# Patient Record
Sex: Male | Born: 1980 | Hispanic: No | Marital: Single | Smoking: Never smoker
Health system: Southern US, Community
[De-identification: ages and names within clinical notes are randomized; demographics above are authoritative.]

---

## 2007-10-15 ENCOUNTER — Ambulatory Visit: Payer: Self-pay | Admitting: Internal Medicine

## 2007-10-15 DIAGNOSIS — IMO0002 Reserved for concepts with insufficient information to code with codable children: Secondary | ICD-10-CM

## 2007-10-15 DIAGNOSIS — R079 Chest pain, unspecified: Secondary | ICD-10-CM

## 2007-10-20 ENCOUNTER — Encounter (INDEPENDENT_AMBULATORY_CARE_PROVIDER_SITE_OTHER): Payer: Self-pay | Admitting: Internal Medicine

## 2007-10-20 ENCOUNTER — Ambulatory Visit (HOSPITAL_COMMUNITY): Admission: RE | Admit: 2007-10-20 | Discharge: 2007-10-20 | Payer: Self-pay | Admitting: Internal Medicine

## 2007-11-02 ENCOUNTER — Ambulatory Visit: Payer: Self-pay | Admitting: Internal Medicine

## 2007-11-12 LAB — CONVERTED CEMR LAB
CO2: 28 meq/L (ref 19–32)
Cholesterol: 211 mg/dL — ABNORMAL HIGH (ref 0–200)
Creatinine, Ser: 1.07 mg/dL (ref 0.40–1.50)
Glucose, Bld: 97 mg/dL (ref 70–99)
Total Bilirubin: 0.7 mg/dL (ref 0.3–1.2)
Total CHOL/HDL Ratio: 4.1
Triglycerides: 98 mg/dL (ref <150)
VLDL: 20 mg/dL (ref 0–40)

## 2009-11-25 IMAGING — CR DG LUMBAR SPINE COMPLETE 4+V
5 series · 5 of 5 positions shown · non-contrast
Comparison: None available

CLINICAL DATA: Chest and low back pain

LUMBAR SPINE - COMPLETE 4+ VIEW

[t l-spine a.p.]
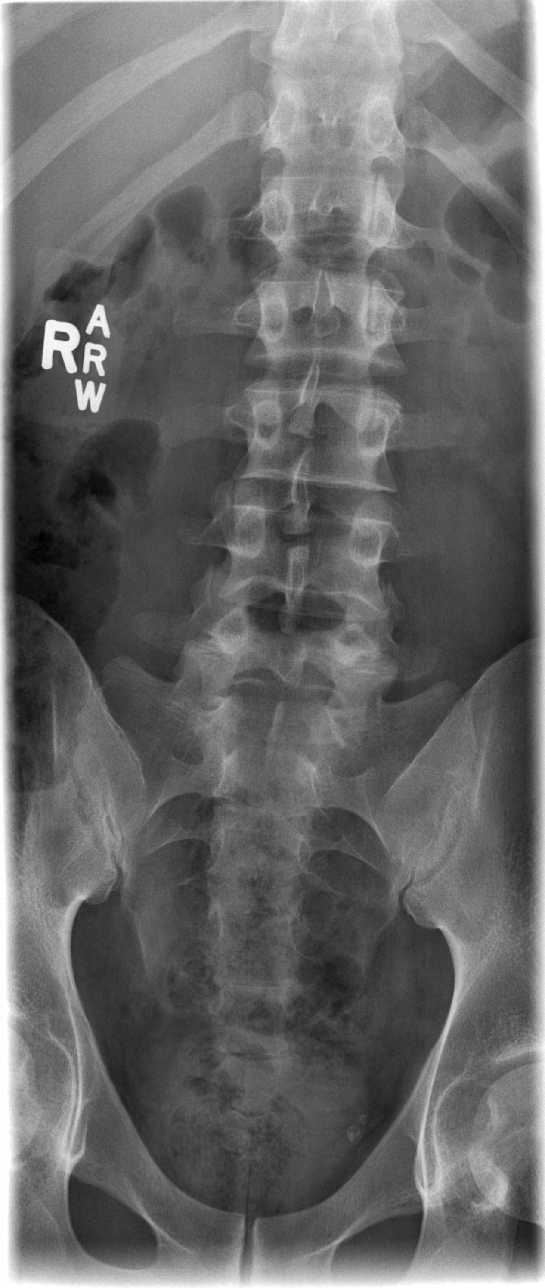

[t l-spine oblique exposure (1 of 2)]
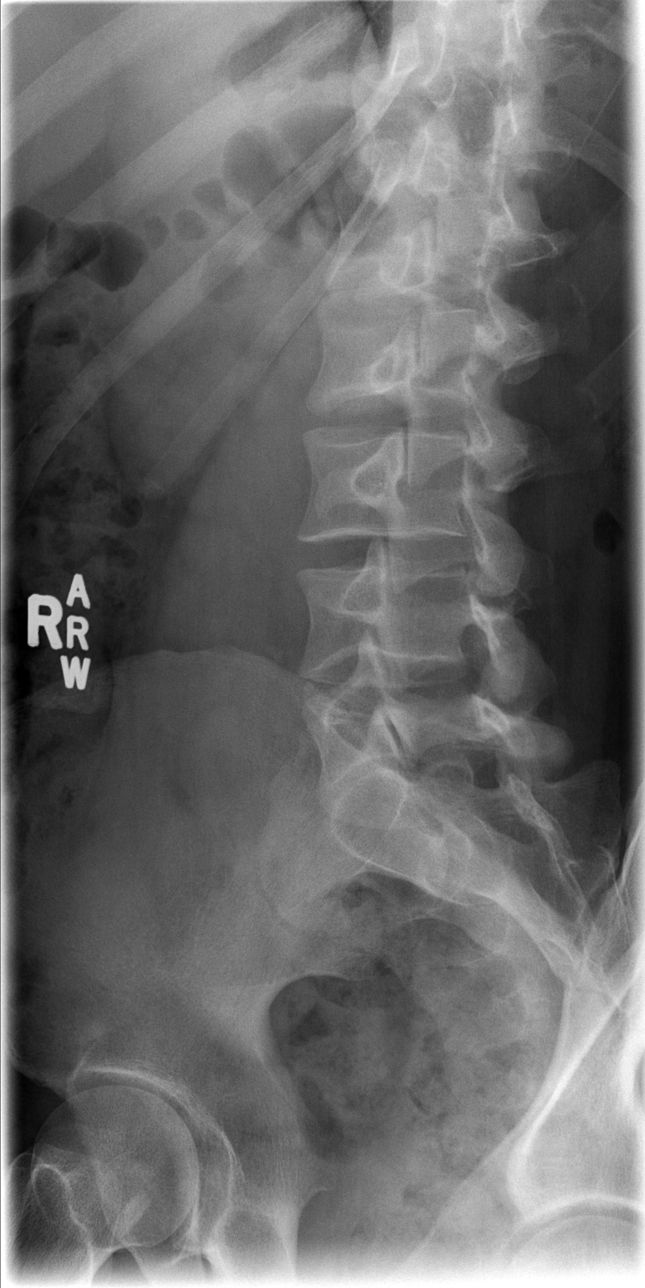

[t l-spine oblique exposure (2 of 2)]
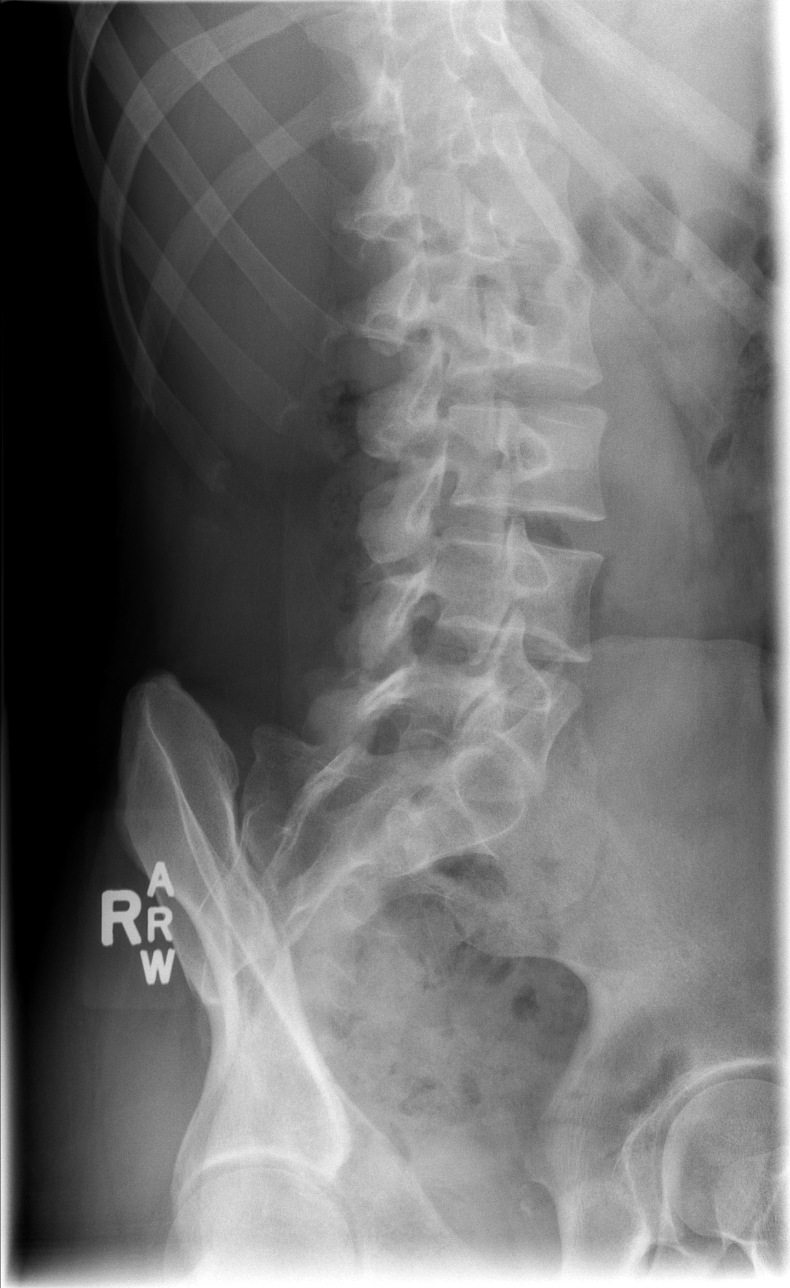

[t l-spine lat]
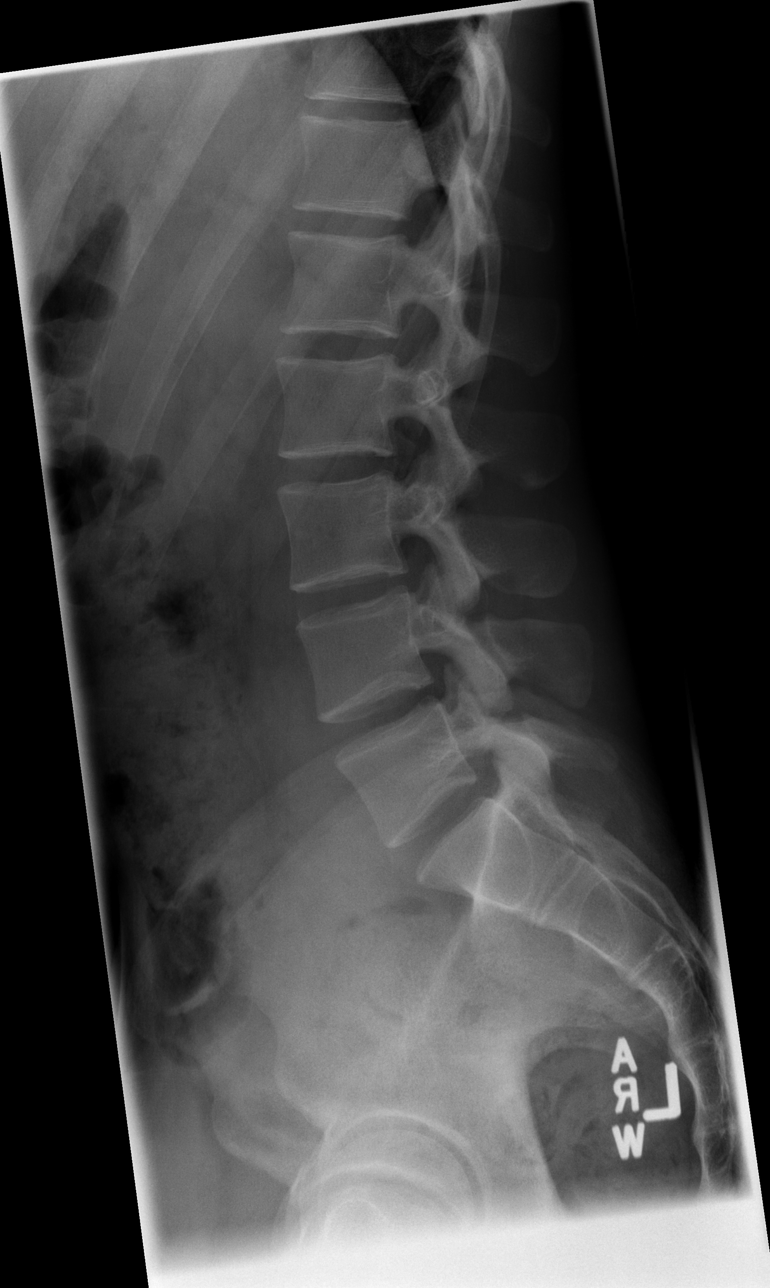

[t l-spine l5-s1 spot]
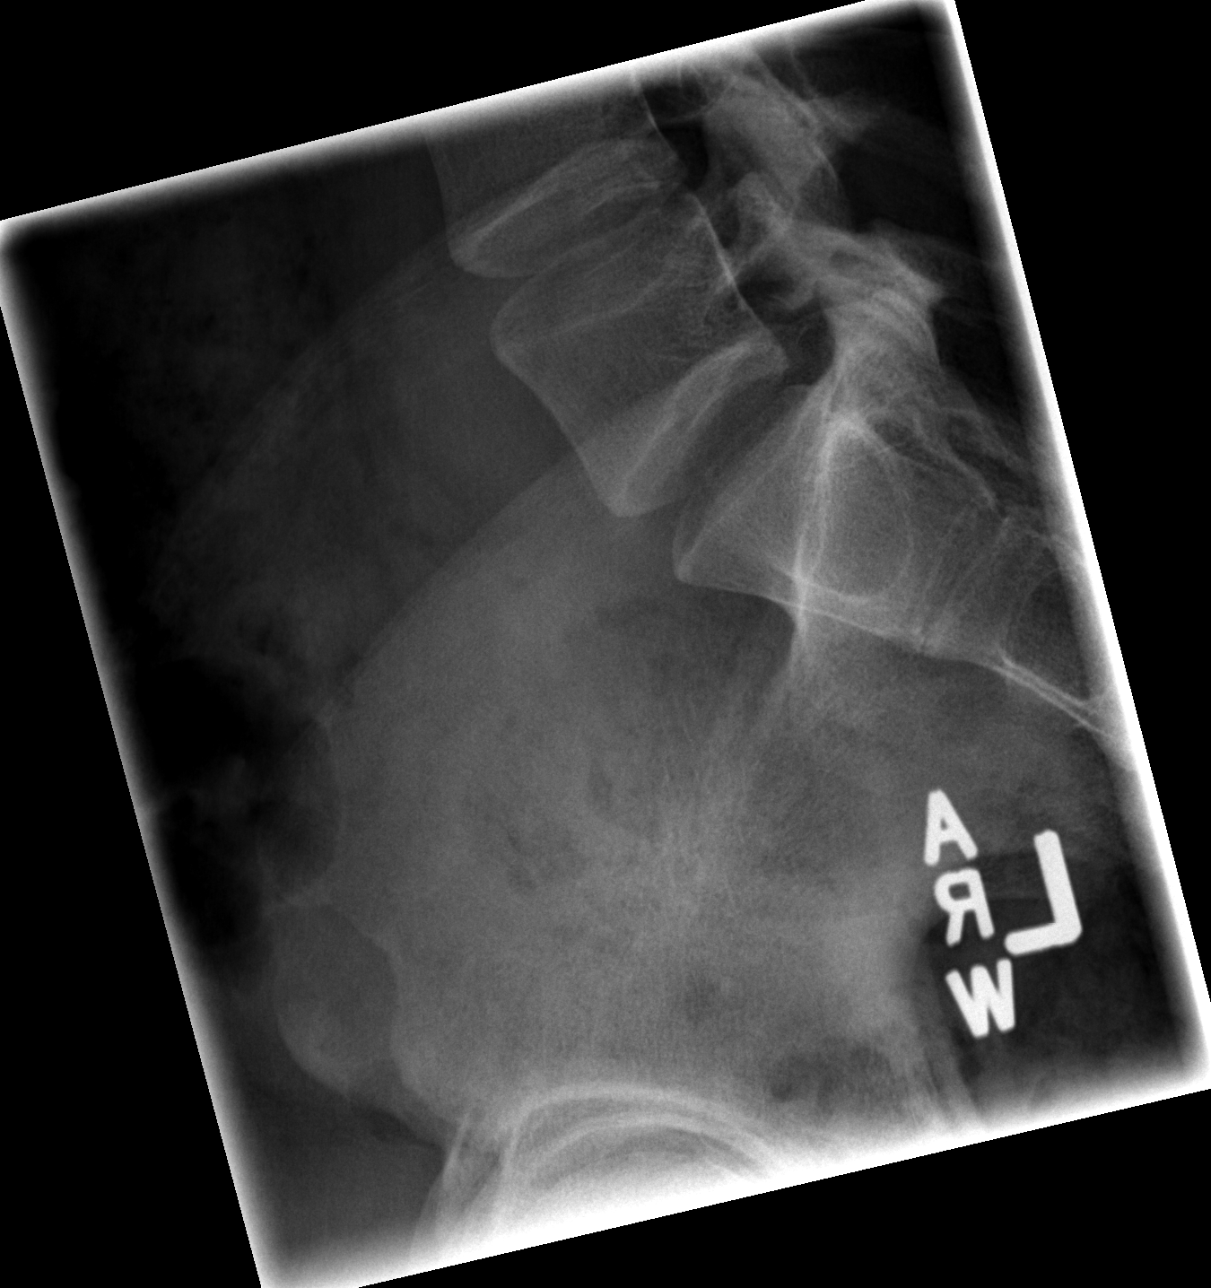

[5 of 5 positions shown; findings below may reference images not displayed]

FINDINGS: There are left pelvic phleboliths. There is no evidence
of lumbar spine fracture.  Alignment is normal.  Intervertebral
disc spaces are maintained.
IMPRESSION: Negative.

## 2016-04-09 DIAGNOSIS — G8929 Other chronic pain: Secondary | ICD-10-CM | POA: Insufficient documentation

## 2020-05-03 ENCOUNTER — Other Ambulatory Visit: Payer: Self-pay

## 2020-05-04 ENCOUNTER — Ambulatory Visit (INDEPENDENT_AMBULATORY_CARE_PROVIDER_SITE_OTHER): Payer: Self-pay | Admitting: Nurse Practitioner

## 2020-05-04 ENCOUNTER — Encounter: Payer: Self-pay | Admitting: Nurse Practitioner

## 2020-05-04 VITALS — BP 100/60 | HR 76 | Temp 97.8°F | Ht 66.75 in | Wt 154.0 lb

## 2020-05-04 DIAGNOSIS — Z136 Encounter for screening for cardiovascular disorders: Secondary | ICD-10-CM

## 2020-05-04 DIAGNOSIS — M25562 Pain in left knee: Secondary | ICD-10-CM | POA: Insufficient documentation

## 2020-05-04 DIAGNOSIS — Z13220 Encounter for screening for lipoid disorders: Secondary | ICD-10-CM

## 2020-05-04 DIAGNOSIS — Z0000 Encounter for general adult medical examination without abnormal findings: Secondary | ICD-10-CM

## 2020-05-04 LAB — LIPID PANEL
Cholesterol: 228 mg/dL — ABNORMAL HIGH (ref 0–200)
HDL: 44.3 mg/dL (ref >39.00)
LDL Cholesterol: 166 mg/dL — ABNORMAL HIGH (ref 0–99)
NonHDL: 183.86
Total CHOL/HDL Ratio: 5
Triglycerides: 87 mg/dL (ref 0.0–149.0)
VLDL: 17.4 mg/dL (ref 0.0–40.0)

## 2020-05-04 LAB — COMPREHENSIVE METABOLIC PANEL
ALT: 21 U/L (ref 0–53)
AST: 18 U/L (ref 0–37)
Albumin: 4.4 g/dL (ref 3.5–5.2)
Alkaline Phosphatase: 75 U/L (ref 39–117)
BUN: 11 mg/dL (ref 6–23)
CO2: 32 mEq/L (ref 19–32)
Calcium: 9.7 mg/dL (ref 8.4–10.5)
Chloride: 102 mEq/L (ref 96–112)
Creatinine, Ser: 1.08 mg/dL (ref 0.40–1.50)
GFR: 87.25 mL/min (ref >60.00)
Glucose, Bld: 95 mg/dL (ref 70–99)
Potassium: 4.1 mEq/L (ref 3.5–5.1)
Sodium: 142 mEq/L (ref 135–145)
Total Bilirubin: 0.8 mg/dL (ref 0.2–1.2)
Total Protein: 7 g/dL (ref 6.0–8.3)

## 2020-05-04 LAB — CBC WITH DIFFERENTIAL/PLATELET
Basophils Absolute: 0 10*3/uL (ref 0.0–0.1)
Basophils Relative: 0.5 % (ref 0.0–3.0)
Eosinophils Absolute: 0.2 10*3/uL (ref 0.0–0.7)
Eosinophils Relative: 4.2 % (ref 0.0–5.0)
HCT: 46.5 % (ref 39.0–52.0)
Hemoglobin: 15.7 g/dL (ref 13.0–17.0)
Lymphocytes Relative: 28.8 % (ref 12.0–46.0)
Lymphs Abs: 1.7 10*3/uL (ref 0.7–4.0)
MCHC: 33.8 g/dL (ref 30.0–36.0)
MCV: 88.6 fl (ref 78.0–100.0)
Monocytes Absolute: 0.3 10*3/uL (ref 0.1–1.0)
Monocytes Relative: 5.9 % (ref 3.0–12.0)
Neutro Abs: 3.5 10*3/uL (ref 1.4–7.7)
Neutrophils Relative %: 60.6 % (ref 43.0–77.0)
Platelets: 286 10*3/uL (ref 150.0–400.0)
RBC: 5.24 Mil/uL (ref 4.22–5.81)
RDW: 13.4 % (ref 11.5–15.5)
WBC: 5.8 10*3/uL (ref 4.0–10.5)

## 2020-05-04 NOTE — Patient Instructions (Signed)
Thank you for choosing [facility].  Go to lab for blood draw.  Preventive Care 39-39 Years Old, Male Preventive care refers to lifestyle choices and visits with your health care provider that can promote health and wellness. This includes:  A yearly physical exam. This is also called an annual wellness visit.  Regular dental and eye exams.  Immunizations.  Screening for certain conditions.  Healthy lifestyle choices, such as: ? Eating a healthy diet. ? Getting regular exercise. ? Not using drugs or products that contain nicotine and tobacco. ? Limiting alcohol use. What can I expect for my preventive care visit? Physical exam Your health care provider may check your:  Height and weight. These may be used to calculate your BMI (body mass index). BMI is a measurement that tells if you are at a healthy weight.  Heart rate and blood pressure.  Body temperature.  Skin for abnormal spots. Counseling Your health care provider may ask you questions about your:  Past medical problems.  Family's medical history.  Alcohol, tobacco, and drug use.  Emotional well-being.  Home life and relationship well-being.  Sexual activity.  Diet, exercise, and sleep habits.  Work and work Astronomer.  Access to firearms. What immunizations do I need? Vaccines are usually given at various ages, according to a schedule. Your health care provider will recommend vaccines for you based on your age, medical history, and lifestyle or other factors, such as travel or where you work.   What tests do I need? Blood tests  Lipid and cholesterol levels. These may be checked every 5 years starting at age 39.  Hepatitis C test.  Hepatitis B test. Screening  Diabetes screening. This is done by checking your blood sugar (glucose) after you have not eaten for a while (fasting).  Genital exam to check for testicular cancer or hernias.  STD (sexually transmitted disease) testing, if  you are at risk. Talk with your health care provider about your test results, treatment options, and if necessary, the need for more tests.   Follow these instructions at home: Eating and drinking  Eat a healthy diet that includes fresh fruits and vegetables, whole grains, lean protein, and low-fat dairy products.  Drink enough fluid to keep your urine pale yellow.  Take vitamin and mineral supplements as recommended by your health care provider.  Do not drink alcohol if your health care provider tells you not to drink.  If you drink alcohol: ? Limit how much you have to 0-2 drinks a day. ? Be aware of how much alcohol is in your drink. In the [place] one drink equals one 12 oz bottle of beer (355 mL), one 5 oz glass of wine (148 mL), or one 1 oz glass of hard liquor (44 mL).   Lifestyle  Take daily care of your teeth and gums. Brush your teeth every morning and night with fluoride toothpaste. Floss one time each day.  Stay active. Exercise for at least 30 minutes 5 or more days each week.  Do not use any products that contain nicotine or tobacco, such as cigarettes, e-cigarettes, and chewing tobacco. If you need help quitting, ask your health care provider.  Do not use drugs.  If you are sexually active, practice safe sex. Use a condom or other form of protection to prevent STIs (sexually transmitted infections).  Find healthy ways to cope with stress, such as: ? Meditation, yoga, or listening to music. ? Journaling. ? Talking to a trusted person. ?  Spending time with friends and family. Safety  Always wear your seat belt while driving or riding in a vehicle.  Do not drive: ? If you have been drinking alcohol. Do not ride with someone who has been drinking. ? When you are tired or distracted. ? While texting.  Wear a helmet and other protective equipment during sports activities.  If you have firearms in your house, make sure you follow all gun safety procedures.  Seek help  if you have been physically or sexually abused. What's next?  Go to your health care provider once a year for an annual wellness visit.  Ask your health care provider how often you should have your eyes and teeth checked.  Stay up to date on all vaccines. This information is not intended to replace advice given to you by your health care provider. Make sure you discuss any questions you have with your health care provider. Document Revised: 11/25/2018 Document Reviewed: 03/05/2018 Elsevier Patient Education  2021 Elsevier Inc.  

## 2020-05-04 NOTE — Progress Notes (Signed)
Subjective:    Patient ID: Jacob Odonnell, male    DOB: June 30, 1980, 40 y.o.   MRN: [id number]  Patient presents today for CPE and establish care  HPI  Sexual History (orientation,birth control, marital status, STD):single, not sexually active  Depression/Suicide: Depression screen [facility] 2/9 05/04/2020  Decreased Interest 0  Down, Depressed, Hopeless 0  PHQ - 2 Score 0  Altered sleeping 1  Tired, decreased energy 1  Change in appetite 0  Feeling bad or failure about yourself  0  Trouble concentrating 0  Moving slowly or fidgety/restless 0  Suicidal thoughts 0  PHQ-9 Score 2  Difficult doing work/chores Not difficult at all   Vision:not needed per patient  Dental:not needed per patient  Immunizations: (TDAP, Hep C screen, Pneumovax, Influenza, zoster)  Health Maintenance  Topic Date Due  . COVID-19 Vaccine (3 - Booster for Moderna series) 03/12/2020  . Flu Shot  06/22/2020*  . Tetanus Vaccine  05/04/2021*  .  Hepatitis C: One time screening is recommended by Center for Disease Control  (CDC) for  adults born from [birth year] through 1964.   05/04/2021*  . HIV Screening  05/04/2021*  *Topic was postponed. The date shown is not the original due date.   Diet:regular. Exercise: none Weight:  Wt Readings from Last 3 Encounters:  05/04/20 154 lb (69.9 kg)   Fall Risk: Fall Risk  05/04/2020  Falls in the past year? 0  Number falls in past yr: 0  Injury with Fall? 0  Risk for fall due to : No Fall Risks  Follow up Falls evaluation completed   Medications and allergies reviewed with patient and updated if appropriate.  Patient Active Problem List   Diagnosis Date Noted  . Left anterior knee pain 05/04/2020  . Chronic right shoulder pain 04/09/2016  . LUMBAR RADICULOPATHY 10/15/2007  . CHEST PAIN 10/15/2007   No current outpatient medications on file prior to visit.   No current facility-administered medications on file prior to visit.   History reviewed. No pertinent  past medical history.  Social History   Socioeconomic History  . Marital status: Single    Spouse name: Not on file  . Number of children: 0  . Years of education: Not on file  . Highest education level: Not on file  Occupational History  . Occupation: Electronics engineer  Tobacco Use  . Smoking status: Never Smoker  . Smokeless tobacco: Never Used  Vaping Use  . Vaping Use: Never used  Substance and Sexual Activity  . Alcohol use: Never  . Drug use: Never  . Sexual activity: Not Currently  Other Topics Concern  . Not on file  Social History Narrative  . Not on file   Social Determinants of Health   Financial Resource Strain: Not on file  Food Insecurity: Not on file  Transportation Needs: Not on file  Physical Activity: Not on file  Stress: Not on file  Social Connections: Not on file    Family History  Problem Relation Age of Onset  . Cancer Mother 41       breast       Review of Systems  Constitutional: Negative for fever, malaise/fatigue and weight loss.  HENT: Negative for congestion and sore throat.   Eyes:       Negative for visual changes  Respiratory: Negative for cough and shortness of breath.   Cardiovascular: Negative for chest pain, palpitations and leg swelling.  Gastrointestinal: Negative for blood in stool, constipation, diarrhea and heartburn.  Genitourinary: Negative for dysuria, frequency and urgency.  Musculoskeletal: Negative for falls, joint pain and myalgias.  Skin: Negative for rash.  Neurological: Negative for dizziness, sensory change and headaches.  Endo/Heme/Allergies: Does not bruise/bleed easily.  Psychiatric/Behavioral: Negative for depression, substance abuse and suicidal ideas. The patient is not nervous/anxious.    Objective:   Vitals:   05/04/20 0849  BP: 100/60  Pulse: 76  Temp: 97.8 F (36.6 C)  SpO2: 98%   Body mass index is 24.3 kg/m.  Physical Examination:  Physical Exam Vitals reviewed.  Constitutional:       General: He is not in acute distress. HENT:     Right Ear: Tympanic membrane, ear canal and external ear normal.     Left Ear: Tympanic membrane, ear canal and external ear normal.     Mouth/Throat:     Mouth: Oropharynx is clear and moist.  Eyes:     General: No scleral icterus.    Extraocular Movements: Extraocular movements intact and EOM normal.     Conjunctiva/sclera: Conjunctivae normal.  Neck:     Thyroid: No thyromegaly.  Cardiovascular:     Rate and Rhythm: Normal rate and regular rhythm.     Pulses: Normal pulses and intact distal pulses.     Heart sounds: Normal heart sounds.  Pulmonary:     Effort: Pulmonary effort is normal.     Breath sounds: Normal breath sounds.  Chest:     Chest wall: No tenderness.  Abdominal:     General: Bowel sounds are normal. There is no distension.     Palpations: Abdomen is soft.     Tenderness: There is no abdominal tenderness.  Musculoskeletal:        General: No tenderness or edema. Normal range of motion.     Cervical back: Normal range of motion and neck supple.     Right lower leg: No edema.     Left lower leg: No edema.  Lymphadenopathy:     Cervical: No cervical adenopathy.  Skin:    General: Skin is warm and dry.  Neurological:     Mental Status: He is alert and oriented to person, place, and time.  Psychiatric:        Mood and Affect: Mood normal.        Behavior: Behavior normal.        Thought Content: Thought content normal.        Judgment: Judgment normal.    ASSESSMENT and PLAN: This visit occurred during the SARS-CoV-2 public health emergency.  Safety protocols were in place, including screening questions prior to the visit, additional usage of staff PPE, and extensive cleaning of exam room while observing appropriate contact time as indicated for disinfecting solutions.   Jacob Odonnell was seen today for establish care.  Diagnoses and all orders for this visit:  Preventative health care -     CBC with  Differential/Platelet -     Comprehensive metabolic panel -     Lipid panel  Encounter for lipid screening for cardiovascular disease -     Lipid panel      Problem List Items Addressed This Visit   None   Visit Diagnoses    Preventative health care    -  Primary   Relevant Orders   CBC with Differential/Platelet   Comprehensive metabolic panel   Lipid panel   Encounter for lipid screening for cardiovascular disease       Relevant Orders   Lipid panel  Follow up: Return in about 1 year (around 05/04/2021) for CPE (fasting).  Alysia Penna, NP

## 2020-08-07 ENCOUNTER — Other Ambulatory Visit: Payer: Self-pay

## 2020-08-10 ENCOUNTER — Encounter: Payer: Self-pay | Admitting: Nurse Practitioner

## 2020-08-10 ENCOUNTER — Other Ambulatory Visit: Payer: Self-pay

## 2020-08-10 ENCOUNTER — Ambulatory Visit (INDEPENDENT_AMBULATORY_CARE_PROVIDER_SITE_OTHER): Payer: Self-pay | Admitting: Nurse Practitioner

## 2020-08-10 VITALS — BP 100/64 | HR 82 | Temp 97.5°F | Ht 66.75 in | Wt 155.2 lb

## 2020-08-10 DIAGNOSIS — M67921 Unspecified disorder of synovium and tendon, right upper arm: Secondary | ICD-10-CM

## 2020-08-10 MED ORDER — IBUPROFEN 600 MG PO TABS
600.0000 mg | ORAL_TABLET | Freq: Three times a day (TID) | ORAL | 0 refills | Status: AC
Start: 2020-08-10 — End: ?

## 2020-08-10 NOTE — Progress Notes (Signed)
   Subjective:  Patient ID: Jacob Odonnell, male    DOB: 12-31-1980  Age: 40 y.o. MRN: [id number]  CC: Acute Visit (Pt c/o right arm pain x 2 weeks. Pt denies any known injury. Pt states pain starts in the middle of his forearm and travels into his shoulder. )  Arm Pain  The incident occurred more than 1 week ago. The injury mechanism was repetitive motion. The pain is present in the right elbow. The quality of the pain is described as aching. Radiates to: right forearm and right shoulder. The pain is severe. The pain has been constant since the incident. Associated symptoms include muscle weakness. Pertinent negatives include no chest pain, numbness or tingling. The symptoms are aggravated by lifting and movement. He has tried acetaminophen for the symptoms. The treatment provided no relief.  works as a Electronics engineer, right hand dorminant  Reviewed past Medical, Social and Family history today.  No outpatient medications prior to visit.   No facility-administered medications prior to visit.    ROS See HPI  Objective:  BP 100/64 (BP Location: Left Arm, Patient Position: Sitting, Cuff Size: Normal)   Pulse 82   Temp (!) 97.5 F (36.4 C) (Temporal)   Ht 5' 6.75" (1.695 m)   Wt 155 lb 3.2 oz (70.4 kg)   SpO2 98%   BMI 24.49 kg/m   Physical Exam Vitals reviewed.  Musculoskeletal:     Right shoulder: Normal.     Left shoulder: Normal.     Right upper arm: Normal.     Left upper arm: Normal.     Right elbow: Swelling present. No deformity, effusion or lacerations. Decreased range of motion. Tenderness present.     Left elbow: Normal.     Right forearm: Tenderness present. No swelling, edema, deformity or bony tenderness.     Left forearm: Normal.     Right wrist: Normal.     Left wrist: Normal.     Right hand: Normal.     Left hand: Normal.     Comments: Tenderness and swelling along R. extensor tendons. No erythema  Neurological:     Mental Status: He is alert and oriented to  person, place, and time.    Assessment & Plan:  This visit occurred during the SARS-CoV-2 public health emergency.  Safety protocols were in place, including screening questions prior to the visit, additional usage of staff PPE, and extensive cleaning of exam room while observing appropriate contact time as indicated for disinfecting solutions.   Jacob Odonnell was seen today for acute visit.  Diagnoses and all orders for this visit:  Tendinopathy of right elbow -     ibuprofen (ADVIL) 600 MG tablet; Take 1 tablet (600 mg total) by mouth every 8 (eight) hours. With food  Try to limit movements that make pain worse. Start ibuprofen as prescribed apply cold compress 2-3x/day, each time. Wear elbow compression sleeve during the day and off at night. If no improvement in 1-2weeks, call office for sports medicine referral.  Problem List Items Addressed This Visit   None   Visit Diagnoses    Tendinopathy of right elbow    -  Primary   Relevant Medications   ibuprofen (ADVIL) 600 MG tablet      Follow-up: No follow-ups on file.  Alysia Penna, NP

## 2020-08-10 NOTE — Patient Instructions (Addendum)
Try to limit movements that make pain worse. Start ibuprofen as prescribed  apply cold compress 2-3x/day, each time. Wear elbow compression sleeve during the day and off at night. If no improvement in 1-2weeks, call office for sports medicine referral.

## 2020-08-14 ENCOUNTER — Encounter: Payer: Self-pay | Admitting: Nurse Practitioner

## 2021-11-15 ENCOUNTER — Encounter: Payer: Self-pay | Admitting: Nurse Practitioner

## 2021-11-15 ENCOUNTER — Ambulatory Visit (INDEPENDENT_AMBULATORY_CARE_PROVIDER_SITE_OTHER): Payer: Self-pay | Admitting: Nurse Practitioner

## 2021-11-15 VITALS — BP 116/70 | HR 74 | Temp 97.3°F | Ht 66.0 in | Wt 155.6 lb

## 2021-11-15 DIAGNOSIS — B029 Zoster without complications: Secondary | ICD-10-CM

## 2021-11-15 NOTE — Patient Instructions (Signed)
No meed for medication at this time Let me know if you decide to get Shingrix vaccine in 64months  Shingles  Shingles is an infection. It gives you a painful skin rash and blisters that have fluid in them. Shingles is caused by the same germ (virus) that causes chickenpox. Shingles only happens in people who: Have had chickenpox. Have been given a shot (vaccine) to protect against chickenpox. Shingles is rare in this group. What are the causes? This condition is caused by varicella-zoster virus. This is the same germ that causes chickenpox. After a person is exposed to the germ, the germ stays in the body but is not active (dormant). Shingles develops if the germ becomes active again (is reactivated). This can happen many years after the first exposure to the germ. It is not known what causes this germ to become active again. What increases the risk? People who have had chickenpox or received the chickenpox shot are at risk for shingles. This infection is more common in people who: Are older than 40 years of age. Have a weakened disease-fighting system (immune system), such as people with: HIV (human immunodeficiency virus). AIDS (acquired immunodeficiency syndrome). Cancer. Are taking medicines that weaken the immune system, such as organ transplant medicines. Have a lot of stress. What are the signs or symptoms? The first symptoms of shingles may be itching, tingling, or pain in an area on your skin. A rash will show on your skin a few days or weeks later. This is what usually happens: The rash is likely to be on one side of your body. The rash usually has a shape like a belt or a band. Over time, the rash turns into fluid-filled blisters. The blisters will break open and change into scabs. The scabs usually dry up in about 2-3 weeks. You may also have: A fever. Chills. A headache. A feeling like you may vomit (nausea). How is this treated? The rash may last for several weeks.  There is not a specific cure for this condition. Your doctor may prescribe medicines. Medicines may: Help with pain. Help you get better sooner. Help to prevent long-term problems. Help with itching (antihistamines). If the area involved is on your face, you may need to see a specialist. This may be an eye doctor or an ear, nose, and throat (ENT) doctor. Follow these instructions at home: Medicines Take over-the-counter and prescription medicines only as told by your doctor. Put on an anti-itch cream or numbing cream where you have a rash, blisters, or scabs. Do this as told by your doctor. Helping with itching and discomfort  Put cold, wet cloths (cold compresses) on the area of the rash or blisters as told by your doctor. Cool baths can help you feel better. Try adding baking soda or dry oatmeal to the water to lessen itching. Do not bathe in hot water. Use calamine lotion as told by your doctor. Blister and rash care Keep your rash covered with a loose bandage (dressing). Wear loose clothing that does not rub on your rash. Wash your hands with soap and water for at least 20 seconds before and after you change your bandage. If you cannot use soap and water, use hand sanitizer. Change your bandage as told by your doctor. Keep your rash and blisters clean. To do this, wash the area with mild soap and cool water as told by your doctor. Check your rash every day for signs of infection. Check for: More redness, swelling, or pain. Fluid  or blood. Warmth. Pus or a bad smell. Do not scratch your rash. Do not pick at your blisters. To help you to not scratch: Keep your fingernails clean and cut short. Wear gloves or mittens when you sleep, if scratching is a problem. General instructions Rest as told by your doctor. Wash your hands often with soap and water for at least 20 seconds. If you cannot use soap and water, use hand sanitizer. Doing this lowers your chance of getting a skin  infection. Your infection can cause chickenpox in people who have never had chickenpox or never got a chickenpox vaccine shot. If you have blisters that did not change into scabs yet, try not to touch other people or be around other people, especially: Babies. Pregnant women. Children who have areas of red, itchy, or rough skin (eczema). Older people who have organ transplants. People who have a long-term (chronic) illness, like cancer or AIDS. Keep all follow-up visits. How is this prevented? A vaccine shot is the best way to prevent shingles and protect against shingles problems. If you have not had a vaccine shot, talk with your doctor about getting it. Where to find more information Centers for Disease Control and Prevention: [employer] Contact a doctor if: Your pain does not get better with medicine. Your pain does not get better after the rash heals. You have any of these signs of infection around the rash: More redness, swelling, or pain. Fluid or blood. Warmth. Pus or a bad smell. You have a fever. Get help right away if: The rash is on your face or nose. You have pain in your face or pain by your eye. You lose feeling on one side of your face. You have trouble seeing. You have ear pain, or you have ringing in your ear. You have a loss of taste. Your condition gets worse. Summary Shingles gives you a painful skin rash and blisters that have fluid in them. Shingles is caused by the same germ (virus) that causes chickenpox. Keep your rash covered with a loose bandage. Wear loose clothing that does not rub on your rash. If you have blisters that did not change into scabs yet, try not to touch other people or be around people. This information is not intended to replace advice given to you by your health care provider. Make sure you discuss any questions you have with your health care provider. Document Revised: 03/06/2020 Document Reviewed: 03/06/2020 Elsevier Patient  Education  2023 [employer].

## 2021-11-15 NOTE — Progress Notes (Signed)
                Established Patient Visit  Patient: Jacob Odonnell   DOB: [birth year]   41 y.o. Male  MRN: [id number] Visit Date: 11/15/2021  Subjective:    Chief Complaint  Patient presents with   Acute Visit    C/o of itchy rash on left ankle and back x 10 days  No other concerns    Rash This is a new problem. The current episode started in the past 7 days. The problem has been resolved since onset. The affected locations include the back. The rash is characterized by itchiness, blistering, pain and redness. He was exposed to nothing. Pertinent negatives include no anorexia, fatigue, fever or joint pain. Past treatments include nothing. His past medical history is significant for varicella. There is no history of allergies, asthma or eczema.  Also reports scratch on left lower leg. Also resolved with use of topical antibiotic ointment.  Reviewed medical, surgical, and social history today  Medications: Outpatient Medications Prior to Visit  Medication Sig   ibuprofen (ADVIL) 600 MG tablet Take 1 tablet (600 mg total) by mouth every 8 (eight) hours. With food (Patient not taking: Reported on 11/15/2021)   No facility-administered medications prior to visit.   Reviewed past medical and social history.   ROS per HPI above      Objective:  BP 116/70 (BP Location: Right Arm, Patient Position: Sitting, Cuff Size: Small)   Pulse 74   Temp (!) 97.3 F (36.3 C) (Temporal)   Ht 5\' 6"  (1.676 m)   Wt 155 lb 9.6 oz (70.6 kg)   SpO2 99%   BMI 25.11 kg/m      Physical Exam Cardiovascular:     Rate and Rhythm: Normal rate.     Pulses: Normal pulses.  Pulmonary:     Effort: Pulmonary effort is normal.  Musculoskeletal:     Right lower leg: No edema.     Left lower leg: No edema.  Skin:    Findings: No erythema or rash.          Comments: Scar of clustered rash: healed, no erythema, no residual pain  Neurological:     Mental Status: He is alert and oriented to person,  place, and time.     No results found for any visits on 11/15/21.    Assessment & Plan:    Problem List Items Addressed This Visit   None Visit Diagnoses     Herpes zoster without complication    -  Primary     No need for antiviral at this time, since rash has healed. We discussed pros and cons of shingrix vaccine if recurrent shingles outbreak.  Return in about 3 months (around 02/15/2022) for CPE (fasting).     02/17/2022, NP

## 2023-06-25 ENCOUNTER — Telehealth: Payer: Self-pay | Admitting: Nurse Practitioner

## 2023-06-25 NOTE — Telephone Encounter (Signed)
 [facility] to schedule appt if needed.

## 2023-11-11 ENCOUNTER — Ambulatory Visit: Attending: Obstetrics and Gynecology

## 2023-11-11 DIAGNOSIS — Z31440 Encounter of male for testing for genetic disease carrier status for procreative management: Secondary | ICD-10-CM

## 2023-11-11 DIAGNOSIS — Z8481 Family history of carrier of genetic disease: Secondary | ICD-10-CM

## 2023-11-20 LAB — HORIZON CUSTOM: REPORT SUMMARY: NEGATIVE

## 2023-11-25 ENCOUNTER — Telehealth: Payer: Self-pay

## 2023-11-25 NOTE — Telephone Encounter (Signed)
 I spoke with the patient and his partner Ebb Carelock DOB 12/17/1991) to discuss his carrier screening results. 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 Please see report for details and residual risk information. A negative result on carrier screening reduces but does not eliminate the chance of being a carrier. The chance this couple's current and future pregnancies would be affected with CF is very low.   Lauraine Bodily, MS, [facility] Certified Genetic Counselor [facility] [phone]
